# Patient Record
Sex: Female | Born: 1995 | Race: White | Hispanic: No | Marital: Single | Smoking: Current some day smoker
Health system: Southern US, Community
[De-identification: ages and names within clinical notes are randomized; demographics above are authoritative.]

## PROBLEM LIST (undated history)

## (undated) DIAGNOSIS — F419 Anxiety disorder, unspecified: Secondary | ICD-10-CM

## (undated) DIAGNOSIS — F4310 Post-traumatic stress disorder, unspecified: Secondary | ICD-10-CM

## (undated) DIAGNOSIS — F329 Major depressive disorder, single episode, unspecified: Secondary | ICD-10-CM

## (undated) DIAGNOSIS — F32A Depression, unspecified: Secondary | ICD-10-CM

## (undated) DIAGNOSIS — R569 Unspecified convulsions: Secondary | ICD-10-CM

## (undated) HISTORY — DX: Unspecified convulsions: R56.9

## (undated) HISTORY — DX: Post-traumatic stress disorder, unspecified: F43.10

## (undated) HISTORY — DX: Anxiety disorder, unspecified: F41.9

## (undated) HISTORY — DX: Depression, unspecified: F32.A

---
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
## 2019-06-23 HISTORY — PX: LEG SURGERY: SHX1003

## 2019-11-19 ENCOUNTER — Other Ambulatory Visit: Payer: Self-pay

## 2019-11-19 ENCOUNTER — Encounter: Payer: Self-pay | Admitting: Physician Assistant

## 2019-11-19 ENCOUNTER — Ambulatory Visit: Payer: Self-pay | Admitting: Physician Assistant

## 2019-11-19 VITALS — BP 100/60 | HR 81 | Temp 98.6°F | Wt 126.2 lb

## 2019-11-19 DIAGNOSIS — R5383 Other fatigue: Secondary | ICD-10-CM

## 2019-11-19 DIAGNOSIS — Z7689 Persons encountering health services in other specified circumstances: Secondary | ICD-10-CM

## 2019-11-19 DIAGNOSIS — F1911 Other psychoactive substance abuse, in remission: Secondary | ICD-10-CM

## 2019-11-19 NOTE — Progress Notes (Signed)
BP 100/60   Pulse 81   Temp 98.6 F (37 C)   Wt 126 lb 3.2 oz (57.2 kg)   SpO2 98%    Subjective:    Patient ID: Rachel Schmidt, female    DOB: April 07, 1996, 24 y.o.   MRN: [id number]  HPI: Rachel Schmidt is a 24 y.o. female presenting on 11/19/2019 for New Patient (Initial Visit) (previous PCP [facility] with Gustavus Messing in [place] pt was last seen there 08/2019. pt is going to [facility] for mental health pt is concerned about her thyroid level because she has a lot of fatigue during the day)   HPI   Pt had a negative covid 19 screening questionnaire.    Pt is a 24yoF who presents today to establish care.    Pt been at [facility] house for 2 week.   She was living in [place] prior to that.   She says her Last seizure was at age 34  Pt complains of fatigue a lot an thinks her thyroid is to blame.     Relevant past medical, surgical, family and social history reviewed and updated as indicated. Interim medical history since our last visit reviewed. Allergies and medications reviewed and updated.   Current Outpatient Medications:  .  hydrOXYzine (ATARAX/VISTARIL) 50 MG tablet, Take 50 mg by mouth in the morning and at bedtime., Disp: , Rfl:  .  topiramate (TOPAMAX) 50 MG tablet, Take 50 mg by mouth 2 (two) times daily., Disp: , Rfl:  .  traZODone (DESYREL) 25 mg TABS tablet, Take 25 mg by mouth at bedtime., Disp: , Rfl:     Review of Systems  Per HPI unless specifically indicated above     Objective:    BP 100/60   Pulse 81   Temp 98.6 F (37 C)   Wt 126 lb 3.2 oz (57.2 kg)   SpO2 98%   Wt Readings from Last 3 Encounters:  11/19/19 126 lb 3.2 oz (57.2 kg)    Physical Exam Vitals reviewed.  Constitutional:      General: She is not in acute distress.    Appearance: Normal appearance. She is well-developed and normal weight. She is not ill-appearing.  HENT:     Head: Normocephalic and atraumatic.  Eyes:     Conjunctiva/sclera: Conjunctivae normal.      Pupils: Pupils are equal, round, and reactive to light.  Neck:     Thyroid: No thyromegaly.  Cardiovascular:     Rate and Rhythm: Normal rate and regular rhythm.  Pulmonary:     Effort: Pulmonary effort is normal.     Breath sounds: Normal breath sounds.  Abdominal:     General: Bowel sounds are normal.     Palpations: Abdomen is soft. There is no mass.     Tenderness: There is no abdominal tenderness.  Musculoskeletal:     Cervical back: Neck supple.     Right lower leg: No edema.     Left lower leg: No edema.  Lymphadenopathy:     Cervical: No cervical adenopathy.  Skin:    General: Skin is warm and dry.  Neurological:     Mental Status: She is alert and oriented to person, place, and time.     Gait: Gait normal.  Psychiatric:        Attention and Perception: Attention normal.        Speech: Speech normal.        Behavior: Behavior is cooperative.  No results found for this or any previous visit.    Assessment & Plan:    Encounter Diagnoses  Name Primary?  . Encounter to establish care Yes  . Fatigue, unspecified type   . Substance abuse in remission [facility])     -will check labs due to fatigue -pt to continue with treatment at [facility] for [place] issues -pt living at [facility] to receive help with substance abuse -will follow up 1 month to review labs.  Pt to contact office sooner prn

## 2019-11-20 ENCOUNTER — Other Ambulatory Visit (HOSPITAL_COMMUNITY)
Admission: RE | Admit: 2019-11-20 | Discharge: 2019-11-20 | Disposition: A | Discharge status: Home / Self Care | Payer: Medicaid Other | Source: Ambulatory Visit | Attending: Physician Assistant | Admitting: Physician Assistant

## 2019-11-20 DIAGNOSIS — R5383 Other fatigue: Secondary | ICD-10-CM | POA: Diagnosis not present

## 2019-11-20 LAB — BASIC METABOLIC PANEL
Anion gap: 7 (ref 5–15)
BUN: 7 mg/dL (ref 6–20)
CO2: 23 mmol/L (ref 22–32)
Calcium: 9.2 mg/dL (ref 8.9–10.3)
Chloride: 107 mmol/L (ref 98–111)
Creatinine, Ser: 0.69 mg/dL (ref 0.44–1.00)
GFR calc Af Amer: >60 mL/min (ref >60)
GFR calc non Af Amer: >60 mL/min (ref >60)
Glucose, Bld: 89 mg/dL (ref 70–99)
Potassium: 4 mmol/L (ref 3.5–5.1)
Sodium: 137 mmol/L (ref 135–145)

## 2019-11-20 LAB — CBC
HCT: 40.3 % (ref 36.0–46.0)
Hemoglobin: 13.7 g/dL (ref 12.0–15.0)
MCH: 29.5 pg (ref 26.0–34.0)
MCHC: 34 g/dL (ref 30.0–36.0)
MCV: 86.7 fL (ref 80.0–100.0)
Platelets: 281 10*3/uL (ref 150–400)
RBC: 4.65 MIL/uL (ref 3.87–5.11)
RDW: 13.4 % (ref 11.5–15.5)
WBC: 7.8 10*3/uL (ref 4.0–10.5)
nRBC: 0 % (ref 0.0–0.2)

## 2019-11-20 LAB — TSH: TSH: 0.711 u[IU]/mL (ref 0.350–4.500)

## 2019-11-24 ENCOUNTER — Ambulatory Visit: Payer: Medicaid Other | Admitting: Physician Assistant

## 2019-11-24 ENCOUNTER — Encounter: Payer: Self-pay | Admitting: Physician Assistant

## 2019-11-24 ENCOUNTER — Other Ambulatory Visit: Payer: Self-pay

## 2019-11-24 ENCOUNTER — Ambulatory Visit: Payer: Medicaid Other | Attending: Internal Medicine

## 2019-11-24 DIAGNOSIS — R05 Cough: Secondary | ICD-10-CM

## 2019-11-24 DIAGNOSIS — R059 Cough, unspecified: Secondary | ICD-10-CM

## 2019-11-24 DIAGNOSIS — R509 Fever, unspecified: Secondary | ICD-10-CM

## 2019-11-24 DIAGNOSIS — Z20822 Contact with and (suspected) exposure to covid-19: Secondary | ICD-10-CM

## 2019-11-24 DIAGNOSIS — J029 Acute pharyngitis, unspecified: Secondary | ICD-10-CM

## 2019-11-24 DIAGNOSIS — R69 Illness, unspecified: Secondary | ICD-10-CM

## 2019-11-24 MED ORDER — BENZONATATE 100 MG PO CAPS
ORAL_CAPSULE | ORAL | 0 refills | Status: AC
Start: 2019-11-24 — End: ?

## 2019-11-24 NOTE — Progress Notes (Signed)
There were no vitals taken for this visit.   Subjective:    Patient ID: Rachel Schmidt, female    DOB: [birth year], 24 y.o.   MRN: [id number]  HPI: Rachel Schmidt is a 24 y.o. female presenting on 11/24/2019 for No chief complaint on file.   HPI   This is a telemedicine appointment due to coronavirus pandemic.  I connected with  Kahli Cuthbert on 11/24/19 by a video enabled telemedicine application and verified that I am speaking with the correct person using two identifiers.   I discussed the limitations of evaluation and management by telemedicine. The patient expressed understanding and agreed to proceed.  Pt is at [facility] house where she is curreently living.  Provider is at office.    Pt is sick.  Symptoms started Sunday. She has cough, fever to 100, eyes sore, Sore Throat.  She says also Just a little bit of SOB.  No N/V/D.    She Got her first covid vaccination a few weeks ago and has appointment to get the second.      She is Using OTC IBU.    She is a smoker.    Pt presented to office last week to establish care.  Today it comes to light that pt has full medicaid.     Relevant past medical, surgical, family and social history reviewed and updated as indicated. Interim medical history since our last visit reviewed. Allergies and medications reviewed and updated.   Current Outpatient Medications:  .  hydrOXYzine (ATARAX/VISTARIL) 50 MG tablet, Take 50 mg by mouth in the morning and at bedtime., Disp: , Rfl:  .  topiramate (TOPAMAX) 50 MG tablet, Take 50 mg by mouth 2 (two) times daily., Disp: , Rfl:  .  traZODone (DESYREL) 25 mg TABS tablet, Take 25 mg by mouth at bedtime., Disp: , Rfl:      Review of Systems  Per HPI unless specifically indicated above     Objective:    There were no vitals taken for this visit.  Wt Readings from Last 3 Encounters:  11/19/19 126 lb 3.2 oz (57.2 kg)    Physical Exam Constitutional:      General: She is not in acute  distress.    Appearance: She is not toxic-appearing.  HENT:     Head: Normocephalic and atraumatic.  Pulmonary:     Effort: No respiratory distress.  Neurological:     Mental Status: She is alert and oriented to person, place, and time.  Psychiatric:        Attention and Perception: Attention normal.        Speech: Speech normal.        Behavior: Behavior is cooperative.     Results for orders placed or performed during the hospital encounter of 11/20/19  Basic metabolic panel  Result Value Ref Range   Sodium 137 135 - 145 mmol/L   Potassium 4.0 3.5 - 5.1 mmol/L   Chloride 107 98 - 111 mmol/L   CO2 23 22 - 32 mmol/L   Glucose, Bld 89 70 - 99 mg/dL   BUN 7 6 - 20 mg/dL   Creatinine, Ser 0.86 0.44 - 1.00 mg/dL   Calcium 9.2 8.9 - 57.8 mg/dL   GFR calc non Af Amer >60 >60 mL/min   GFR calc Af Amer >60 >60 mL/min   Anion gap 7 5 - 15  CBC  Result Value Ref Range   WBC 7.8 4.0 - 10.5 K/uL   RBC 4.65  3.87 - 5.11 MIL/uL   Hemoglobin 13.7 12.0 - 15.0 g/dL   HCT 40.3 36.0 - 46.0 %   MCV 86.7 80.0 - 100.0 fL   MCH 29.5 26.0 - 34.0 pg   MCHC 34.0 30.0 - 36.0 g/dL   RDW 13.4 11.5 - 15.5 %   Platelets 281 150 - 400 K/uL   nRBC 0.0 0.0 - 0.2 %  TSH  Result Value Ref Range   TSH 0.711 0.350 - 4.500 uIU/mL      Assessment & Plan:   Encounter Diagnoses  Name Primary?  . Person under investigation for COVID-19 Yes  . Sick   . Cough   . Fever, unspecified fever cause   . Sore throat     -pt is scheduled for covid testing -rx tessalon to use as needed for cough.  Counseled pt to use drive thru for pick up as she should not go in to [place] at this time due to she is sick -reviewed labs with pt.  All are normal -discussed with pt that she will not be eligible for services at [facility] since she has insurance.  She will be called with test results when available.  Pt states understanding

## 2019-11-25 LAB — NOVEL CORONAVIRUS, NAA: SARS-CoV-2, NAA: NOT DETECTED

## 2019-11-25 LAB — SARS-COV-2, NAA 2 DAY TAT

## 2019-11-26 ENCOUNTER — Telehealth: Payer: Self-pay | Admitting: Student

## 2019-11-26 NOTE — Telephone Encounter (Signed)
LPN called [facility] phone number [phone] and spoke with staff stating staff was at the office and pt is at the house resting due to being sick. LPN asks if staff could relay the message that [facility] is attempting to contact her to giver her results. Staff states she will have pt call the office.

## 2019-11-26 NOTE — Telephone Encounter (Signed)
PA called and discussed negative covid test results with pt.  All questions were answered.

## 2019-12-09 ENCOUNTER — Emergency Department (HOSPITAL_COMMUNITY)
Admission: EM | Admit: 2019-12-09 | Discharge: 2019-12-09 | Disposition: A | Discharge status: Home / Self Care | Payer: Medicaid Other | Attending: Emergency Medicine | Admitting: Emergency Medicine

## 2019-12-09 ENCOUNTER — Encounter (HOSPITAL_COMMUNITY): Payer: Self-pay | Admitting: *Deleted

## 2019-12-09 ENCOUNTER — Emergency Department (HOSPITAL_COMMUNITY): Payer: Medicaid Other

## 2019-12-09 ENCOUNTER — Other Ambulatory Visit: Payer: Self-pay

## 2019-12-09 DIAGNOSIS — Y939 Activity, unspecified: Secondary | ICD-10-CM | POA: Insufficient documentation

## 2019-12-09 DIAGNOSIS — W109XXA Fall (on) (from) unspecified stairs and steps, initial encounter: Secondary | ICD-10-CM | POA: Insufficient documentation

## 2019-12-09 DIAGNOSIS — F17210 Nicotine dependence, cigarettes, uncomplicated: Secondary | ICD-10-CM | POA: Diagnosis not present

## 2019-12-09 DIAGNOSIS — Z79899 Other long term (current) drug therapy: Secondary | ICD-10-CM | POA: Insufficient documentation

## 2019-12-09 DIAGNOSIS — M549 Dorsalgia, unspecified: Secondary | ICD-10-CM | POA: Diagnosis present

## 2019-12-09 DIAGNOSIS — Y929 Unspecified place or not applicable: Secondary | ICD-10-CM | POA: Insufficient documentation

## 2019-12-09 DIAGNOSIS — Y999 Unspecified external cause status: Secondary | ICD-10-CM | POA: Diagnosis not present

## 2019-12-09 DIAGNOSIS — W19XXXA Unspecified fall, initial encounter: Secondary | ICD-10-CM

## 2019-12-09 LAB — PREGNANCY, URINE: Preg Test, Ur: NEGATIVE

## 2019-12-09 NOTE — ED Provider Notes (Signed)
[facility] EMERGENCY DEPARTMENT Provider Note   CSN: [id number] Arrival date & time: 12/09/19  1017     History Chief Complaint  Patient presents with  . Fall    Rachel Schmidt is a 24 y.o. female.  HPI   Patient presents to the emergency department with chief complaint of back pain.  Patient states she fell yesterday walking down the stairs and landed on her back.  Patient denies hitting her head or losing consciousness, headache, nausea, vomiting.  Patient does not have any significant medical history, she denies being on any blood thinners.  Patient states she has taken some Tylenol that helps with the pain but it becomes worse when she moves it or if someone touches her back.  She denies any numbness or tingling moving up or down her leg, she denies any urinary incontinence or bowel incontinence.     Past Medical History:  Diagnosis Date  . Anxiety   . Depression   . PTSD (post-traumatic stress disorder)   . 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   Past Surgical History:  Procedure Laterality Date  . LEG SURGERY Left 06/2019   after fracture     OB History   No obstetric history on file.     Family History  Problem Relation Age of Onset  . Alcohol abuse Mother   . Anxiety disorder Mother   . Bipolar disorder Mother   . Depression Mother   . Anxiety disorder Father   . Depression Father   . Post-traumatic stress disorder Father   . Bipolar disorder Sister   . Depression Sister   . Anxiety disorder Sister   . Alcohol abuse Maternal Aunt   . Alcohol abuse Maternal Uncle     Social History   Tobacco Use  . Smoking status: Current Some Day Smoker  . Smokeless tobacco: Never Used  . Tobacco comment: occassional cigarette  Substance Use Topics  . Alcohol use: Not Currently    Comment: alcoholic.  none  since 10-27-19.   . Drug use: Not Currently    Types: Marijuana, Cocaine,  Heroin    Comment: last marijuana use 09/2019    Home Medications Prior to Admission medications   Medication Sig Start Date End Date Taking? Authorizing Provider  benzonatate (TESSALON PERLES) 100 MG capsule 1-2 po q 8 hour prn cough 11/24/19   Jacquelin Hawking, PA-C  hydrOXYzine (ATARAX/VISTARIL) 50 MG tablet Take 50 mg by mouth in the morning and at bedtime.    [provider]  topiramate (TOPAMAX) 50 MG tablet Take 50 mg by mouth 2 (two) times daily.    [provider]  traZODone (DESYREL) 25 mg TABS tablet Take 25 mg by mouth at bedtime.    [provider]    Allergies    Keppra [levetiracetam], Lamictal [lamotrigine], and Tegretol [carbamazepine]  Review of Systems   Review of Systems  Constitutional: Negative for chills and fever.  HENT: Negative for congestion and sore throat.   Respiratory: Negative for shortness of breath.   Cardiovascular: Negative for chest pain and leg swelling.  Gastrointestinal: Negative for abdominal pain, constipation, diarrhea, nausea and vomiting.  Genitourinary: Negative for dysuria, enuresis and flank pain.  Musculoskeletal: Positive for back pain.       Denies numbness or tingling moving down her leg.  She does have some left knee pain but admits that it is from  her recent surgery on it.  Denies falling on it.  Skin: Negative for rash.  Neurological: Negative for dizziness, light-headedness and headaches.  Hematological: Does not bruise/bleed easily.    Physical Exam Updated Vital Signs BP 99/64 (BP Location: Left Arm)   Pulse 94   Temp 98.3 F (36.8 C) (Oral)   Ht 5\' 3"  (1.6 m)   Wt 54.4 kg   LMP 11/16/2019 Comment: Negative U-preg in ED 12/09/2019  SpO2 100%   BMI 21.26 kg/m   Physical Exam Vitals and nursing note reviewed.  Constitutional:      General: She is not in acute distress.    Appearance: She is not ill-appearing.  HENT:     Head: Normocephalic and atraumatic.     Nose: No congestion.      Mouth/Throat:     Mouth: Mucous membranes are moist.     Pharynx: Oropharynx is clear.  Eyes:     General: No scleral icterus. Cardiovascular:     Rate and Rhythm: Normal rate and regular rhythm.     Pulses: Normal pulses.     Heart sounds: No murmur. No friction rub. No gallop.   Pulmonary:     Effort: No respiratory distress.     Breath sounds: No wheezing, rhonchi or rales.  Abdominal:     General: There is no distension.     Tenderness: There is no abdominal tenderness. There is no guarding.  Musculoskeletal:        General: Tenderness present. No swelling or deformity.     Comments: Patient had some tenderness to palpation on her lumbar spine.  There is no abnormalities noted, or bruising.  She denies any numbness or tingling down her leg or into her feet.  Skin:    General: Skin is warm and dry.     Findings: No rash.  Neurological:     General: No focal deficit present.     Mental Status: She is alert and oriented to person, place, and time.  Psychiatric:        Mood and Affect: Mood normal.     ED Results / Procedures / Treatments   Labs (all labs ordered are listed, but only abnormal results are displayed) Labs Reviewed  PREGNANCY, URINE    EKG None  Radiology DG Lumbar Spine Complete  Result Date: 12/09/2019 CLINICAL DATA:  Fall down steps last night. Low back pain and tenderness. Initial encounter. EXAM: LUMBAR SPINE - COMPLETE 4+ VIEW COMPARISON:  None. FINDINGS: There is no evidence of lumbar spine fracture. Alignment is normal. Intervertebral disc spaces are maintained. No evidence of facet arthropathy or other osseous abnormality. IMPRESSION: Negative. Electronically Signed   By: 12/11/2019 M.D.   On: 12/09/2019 12:12    Procedures Procedures (including critical care time)  Medications Ordered in ED Medications - No data to display  ED Course  I have reviewed the triage vital signs and the nursing notes.  Pertinent labs & imaging results that  were available during my care of the patient were reviewed by me and considered in my medical decision making (see chart for details).  Clinical Course as of Dec 09 1238  Wed Dec 09, 2019  1218 Pregnancy test was negative, I personally reviewed the patient's lumbar spine x-ray which does not show any fractures, dislocations, or other abnormalities.   [WF]    Clinical Course User Index [WF] 1219   MDM Rules/Calculators/A&P  Patient does not appear to be in any acute distress, she is resting comfortably in bed, breathing without difficulty.  Patient's vitals remained stable.  I have reviewed the patient's x-ray which did not show any abnormalities, dislocations, or fractures.  Patient is not having any neurological deficits, denies any numbness or tingling in her legs or feet and denies  loss control of her bowels or bladder.  It is unlikely that the patient suffered herniated disc or is suffering from cauda equina.    It is likely that the patient pain is a result of muscle tenderness.  It is recommended that the patient alternates been taking ibuprofen and Tylenol for pain as well as ice the area as needed.  Patient was given information for finding a primary care doctor and instructed to follow-up in 2 weeks if pain persists.  Patient is also instructed if she develops numbness or tingling or loses control of her bladder or bowel she should come back for further evaluation. Final Clinical Impression(s) / ED Diagnoses Final diagnoses:  Fall, initial encounter    Rx / DC Orders ED Discharge Orders    None       Aron Baba 12/09/19 1240    Carmin Muskrat, MD 12/09/19 2051

## 2019-12-09 NOTE — ED Triage Notes (Signed)
Fell at home last night , pain in lower back

## 2019-12-09 NOTE — Discharge Instructions (Addendum)
I would alternate between taking ibuprofen and Tylenol every 6 hours for pain, follow instructions on bottle.  I would also ice the area, do not place ice on bare skin.  You can rest for 1 day but after I would stretch your back and continue to be active as you do not want to become stiff.    I have also attached a resource guide to help you find a primary care doctor.  If pain continues for more than 2 weeks I would go ahead and follow-up with a primary care doctor for further management and treatment.    If you develop numbness or tingling in your legs or feet, there is control of your bladder or bowel I want you to immediately come back to emergency department for further evaluation

## 2019-12-17 ENCOUNTER — Ambulatory Visit: Payer: Self-pay | Admitting: Physician Assistant

## 2020-03-16 ENCOUNTER — Other Ambulatory Visit: Payer: Medicaid Other

## 2020-03-16 ENCOUNTER — Other Ambulatory Visit: Payer: Self-pay

## 2020-03-16 DIAGNOSIS — Z20822 Contact with and (suspected) exposure to covid-19: Secondary | ICD-10-CM

## 2020-03-17 LAB — NOVEL CORONAVIRUS, NAA: SARS-CoV-2, NAA: NOT DETECTED

## 2020-03-17 LAB — SARS-COV-2, NAA 2 DAY TAT

## 2020-05-31 ENCOUNTER — Emergency Department (HOSPITAL_COMMUNITY)
Admission: EM | Admit: 2020-05-31 | Discharge: 2020-05-31 | Discharge status: Against Medical Advice | Payer: Medicaid Other

## 2020-05-31 ENCOUNTER — Other Ambulatory Visit: Payer: Self-pay

## 2020-06-22 ENCOUNTER — Other Ambulatory Visit: Payer: Self-pay | Admitting: Adult Health

## 2020-06-22 ENCOUNTER — Other Ambulatory Visit (HOSPITAL_COMMUNITY): Payer: Self-pay | Admitting: Adult Health

## 2020-06-22 DIAGNOSIS — S060X0A Concussion without loss of consciousness, initial encounter: Secondary | ICD-10-CM

## 2020-12-31 IMAGING — DX DG LUMBAR SPINE COMPLETE 4+V
5 series · 5 of 5 positions shown · non-contrast
Comparison: None.

CLINICAL DATA: Fall down steps last night. Low back pain and
tenderness. Initial encounter.

EXAM:
LUMBAR SPINE - COMPLETE 4+ VIEW

[l-spine ap]
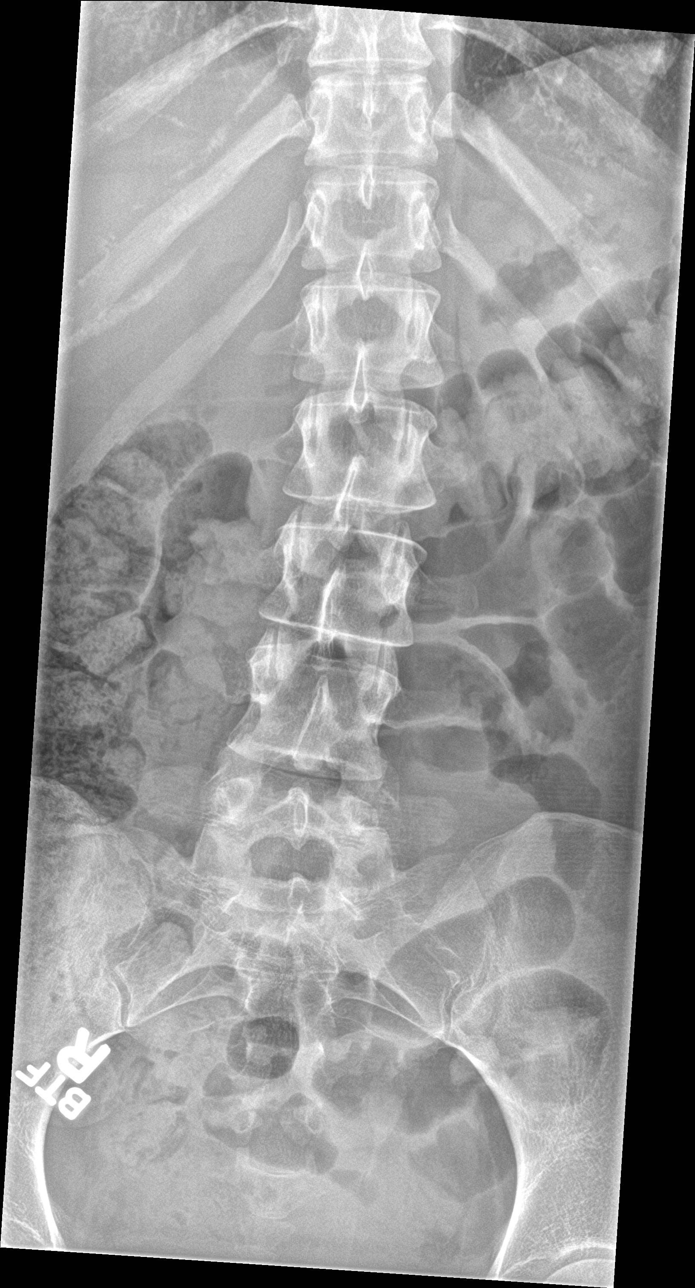

[l-spine obl (1 of 2)]
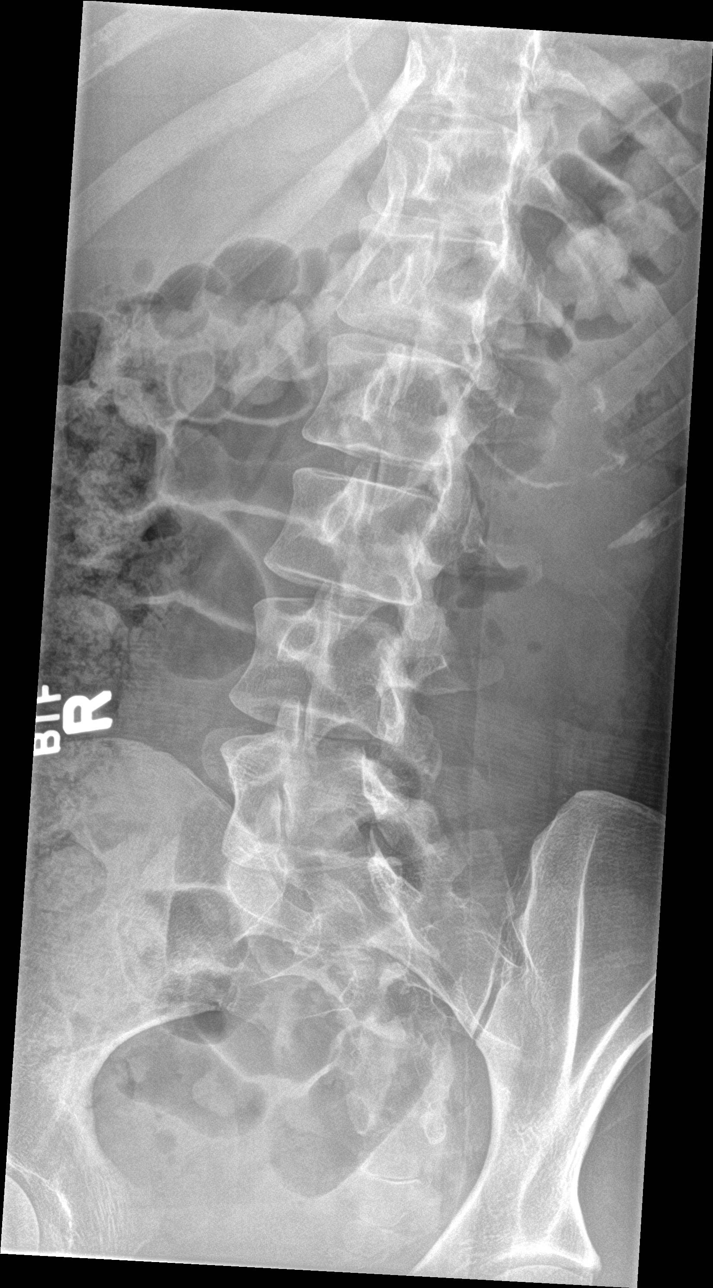

[l-spine obl (2 of 2)]
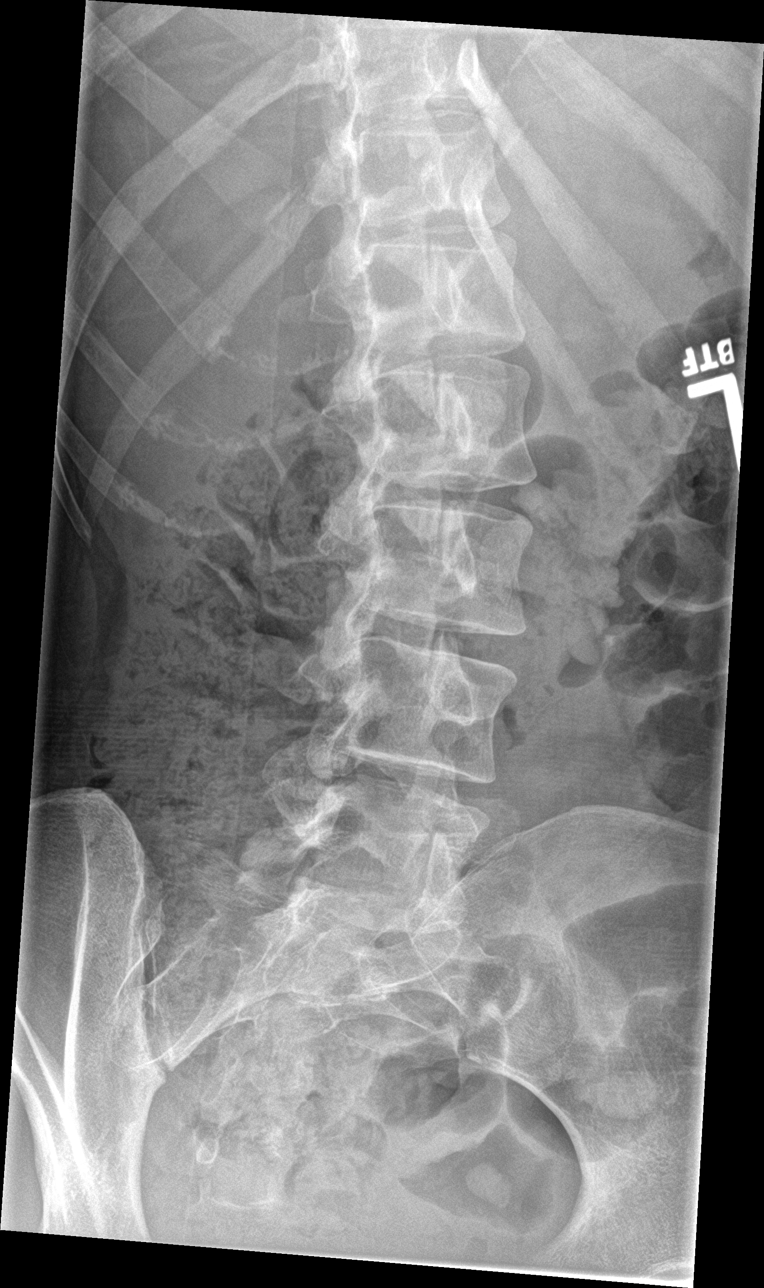

[l-spine lat]
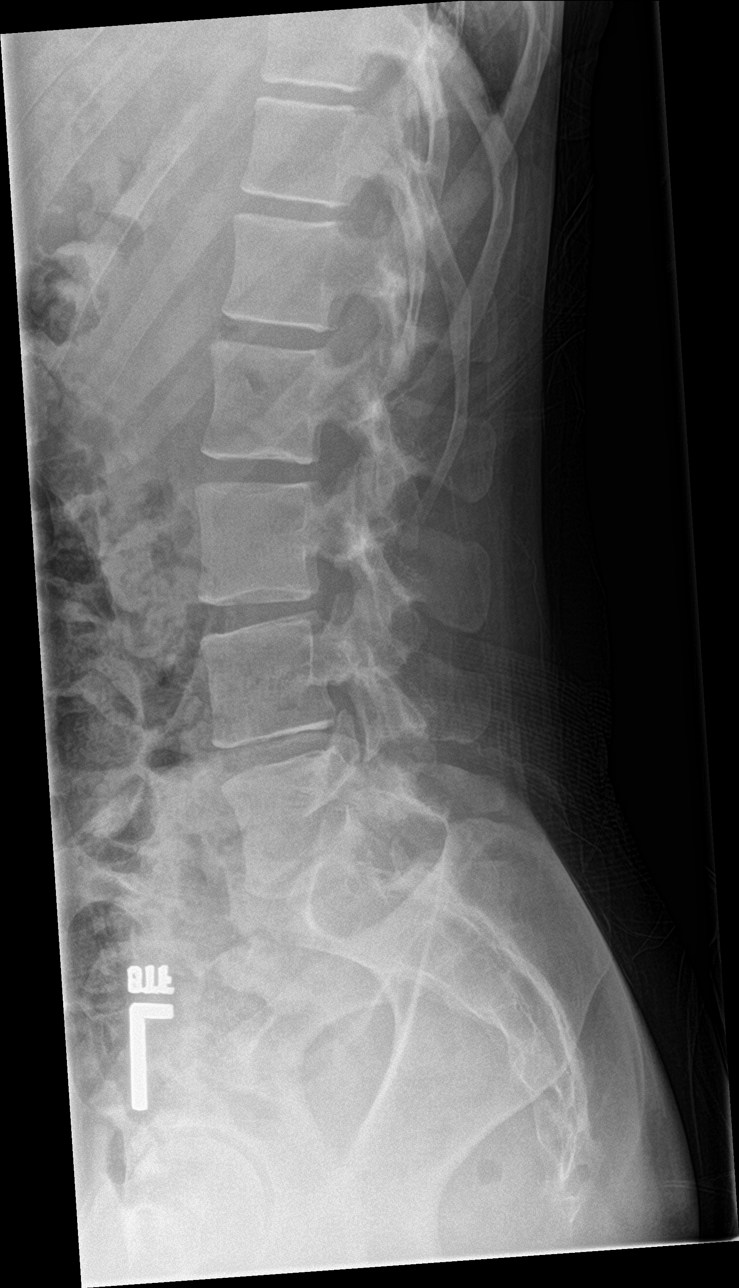

[l-spine spot]
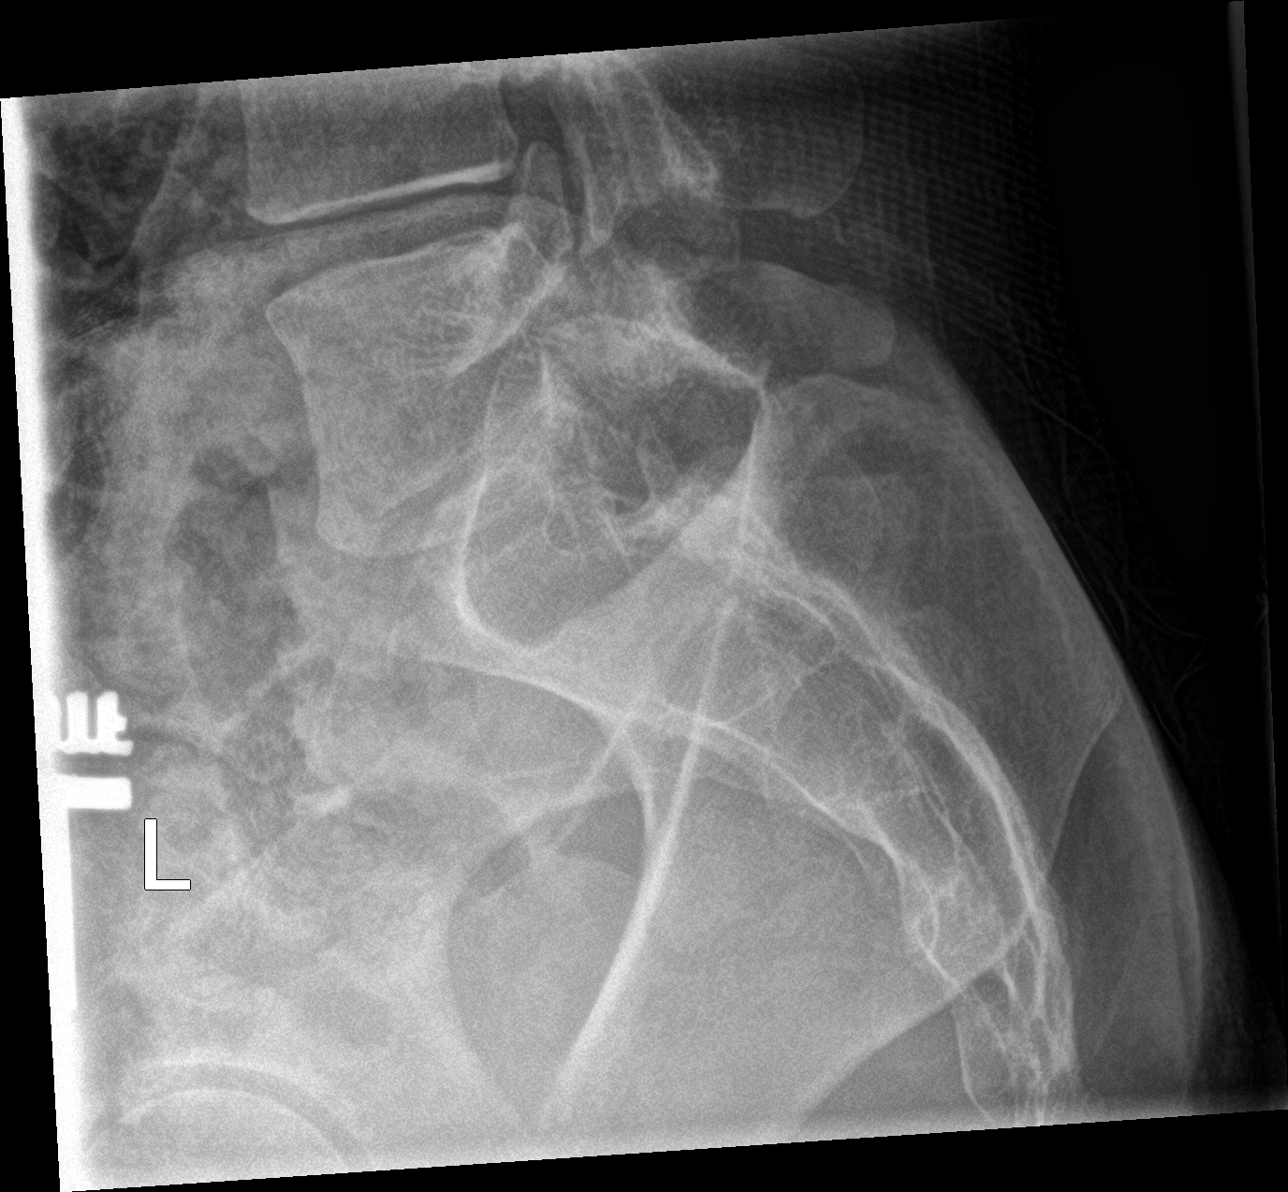

[5 of 5 positions shown; findings below may reference images not displayed]

FINDINGS: There is no evidence of lumbar spine fracture. Alignment is normal.
Intervertebral disc spaces are maintained. No evidence of facet
arthropathy or other osseous abnormality.
IMPRESSION: Negative.
